# Patient Record
Sex: Male | Born: 1990 | Hispanic: No | Smoking: Never smoker
Health system: Southern US, Community
[De-identification: ages and names within clinical notes are randomized; demographics above are authoritative.]

---

## 2017-07-14 ENCOUNTER — Encounter: Payer: Self-pay | Admitting: Physician Assistant

## 2017-07-14 ENCOUNTER — Ambulatory Visit (INDEPENDENT_AMBULATORY_CARE_PROVIDER_SITE_OTHER): Payer: BLUE CROSS/BLUE SHIELD | Admitting: Physician Assistant

## 2017-07-14 VITALS — BP 118/73 | HR 74 | Temp 97.9°F | Resp 18 | Ht 70.55 in | Wt 153.2 lb

## 2017-07-14 DIAGNOSIS — Z131 Encounter for screening for diabetes mellitus: Secondary | ICD-10-CM

## 2017-07-14 DIAGNOSIS — Z13220 Encounter for screening for lipoid disorders: Secondary | ICD-10-CM | POA: Diagnosis not present

## 2017-07-14 DIAGNOSIS — Z130 Encounter for screening for diseases of the blood and blood-forming organs and certain disorders involving the immune mechanism: Secondary | ICD-10-CM

## 2017-07-14 DIAGNOSIS — M79671 Pain in right foot: Secondary | ICD-10-CM

## 2017-07-14 DIAGNOSIS — R0981 Nasal congestion: Secondary | ICD-10-CM | POA: Diagnosis not present

## 2017-07-14 DIAGNOSIS — Z114 Encounter for screening for human immunodeficiency virus [HIV]: Secondary | ICD-10-CM

## 2017-07-14 DIAGNOSIS — Z0000 Encounter for general adult medical examination without abnormal findings: Secondary | ICD-10-CM | POA: Diagnosis not present

## 2017-07-14 LAB — POCT URINALYSIS DIP (MANUAL ENTRY)
Bilirubin, UA: NEGATIVE
Glucose, UA: NEGATIVE mg/dL
Ketones, POC UA: NEGATIVE mg/dL
Leukocytes, UA: NEGATIVE
Nitrite, UA: NEGATIVE
Protein Ur, POC: NEGATIVE mg/dL
Spec Grav, UA: 1.02 (ref 1.010–1.025)
Urobilinogen, UA: 0.2 U/dL
pH, UA: 6.5 (ref 5.0–8.0)

## 2017-07-14 MED ORDER — FLUTICASONE PROPIONATE 50 MCG/ACT NA SUSP: 2.0000 | Freq: Every day | NASAL | 6 refills | Status: AC

## 2017-07-14 MED ORDER — MELOXICAM 15 MG PO TABS: 15.0000 mg | ORAL_TABLET | Freq: Every day | ORAL | 1 refills | Status: AC

## 2017-07-14 MED ORDER — CETIRIZINE HCL 10 MG PO TABS: 10.0000 mg | ORAL_TABLET | Freq: Every day | ORAL | 11 refills | Status: DC

## 2017-07-14 NOTE — Patient Instructions (Addendum)
You will be contacted with your lab results when they come back. Please make appointment with a dentist to have your teeth cleaned.  Go to the [employer] store and buy orthotics to wear in your shoes. Start taking Meloxicam for your foot pain. Use ice as needed for foot pain. Keep ice on your foot 20 minutes a time 2-3 times daily.  Come back if you are not better in 2-3 weeks.   Thank you for coming in today. I hope you feel we met your needs.  Feel free to call [facility] if you have any questions or further requests.  Please consider signing up for MyChart if you do not already have it, as this is a great way to communicate with me.  Best,  Whitney McVey, PA-C   Plantar Fasciitis Plantar fasciitis is a painful foot condition that affects the heel. It occurs when the band of tissue that connects the toes to the heel bone (plantar fascia) becomes irritated. This can happen after exercising too much or doing other repetitive activities (overuse injury). The pain from plantar fasciitis can range from mild irritation to severe pain that makes it difficult for you to walk or move. The pain is usually worse in the morning or after you have been sitting or lying down for a while. What are the causes? This condition may be caused by:  Standing for long periods of time.  Wearing shoes that do not fit.  Doing high-impact activities, including running, aerobics, and ballet.  Being overweight.  Having an abnormal way of walking (gait).  Having tight calf muscles.  Having high arches in your feet.  Starting a new athletic activity.  What are the signs or symptoms? The main symptom of this condition is heel pain. Other symptoms include:  Pain that gets worse after activity or exercise.  Pain that is worse in the morning or after resting.  Pain that goes away after you walk for a few minutes.  How is this diagnosed? This condition may be diagnosed based on your signs and symptoms. Your health  care provider will also do a physical exam to check for:  A tender area on the bottom of your foot.  A high arch in your foot.  Pain when you move your foot.  Difficulty moving your foot.  You may also need to have imaging studies to confirm the diagnosis. These can include:  X-rays.  Ultrasound.  MRI.  How is this treated? Treatment for plantar fasciitis depends on the severity of the condition. Your treatment may include:  Rest, ice, and over-the-counter pain medicines to manage your pain.  Exercises to stretch your calves and your plantar fascia.  A splint that holds your foot in a stretched, upward position while you sleep (night splint).  Physical therapy to relieve symptoms and prevent problems in the future.  Cortisone injections to relieve severe pain.  Extracorporeal shock wave therapy (ESWT) to stimulate damaged plantar fascia with electrical impulses. It is often used as a last resort before surgery.  Surgery, if other treatments have not worked after 12 months.  Follow these instructions at home:  Take medicines only as directed by your health care provider.  Avoid activities that cause pain.  Roll the bottom of your foot over a bag of ice or a bottle of cold water. Do this for 20 minutes, 3-4 times a day.  Perform simple stretches as directed by your health care provider.  Try wearing athletic shoes with air-sole  or gel-sole cushions or soft shoe inserts.  Wear a night splint while sleeping, if directed by your health care provider.  Keep all follow-up appointments with your health care provider. How is this prevented?  Do not perform exercises or activities that cause heel pain.  Consider finding low-impact activities if you continue to have problems.  Lose weight if you need to. The best way to prevent plantar fasciitis is to avoid the activities that aggravate your plantar fascia. Contact a health care provider if:  Your symptoms do not go  away after treatment with home care measures.  Your pain gets worse.  Your pain affects your ability to move or do your daily activities. This information is not intended to replace advice given to you by your health care provider. Make sure you discuss any questions you have with your health care provider. Document Released: 09/10/2001 Document Revised: 05/20/2016 Document Reviewed: 10/26/2014 Elsevier Interactive Patient Education  2018 [place] Maintenance, Male A healthy lifestyle and preventive care is important for your health and wellness. Ask your health care provider about what schedule of regular examinations is right for you. What should I know about weight and diet? Eat a Healthy Diet  Eat plenty of vegetables, fruits, whole grains, low-fat dairy products, and lean protein.  Do not eat a lot of foods high in solid fats, added sugars, or salt.  Maintain a Healthy Weight Regular exercise can help you achieve or maintain a healthy weight. You should:  Do at least 150 minutes of exercise each week. The exercise should increase your heart rate and make you sweat (moderate-intensity exercise).  Do strength-training exercises at least twice a week.  Watch Your Levels of Cholesterol and Blood Lipids  Have your blood tested for lipids and cholesterol every 5 years starting at 28 years of age. If you are at high risk for heart disease, you should start having your blood tested when you are 28 years old. You may need to have your cholesterol levels checked more often if: ? Your lipid or cholesterol levels are high. ? You are older than 28 years of age. ? You are at high risk for heart disease.  What should I know about cancer screening? Many types of cancers can be detected early and may often be prevented. Lung Cancer  You should be screened every year for lung cancer if: ? You are a current smoker who has smoked for at least 30 years. ? You are a former smoker  who has quit within the past 15 years.  Talk to your health care provider about your screening options, when you should start screening, and how often you should be screened.  Colorectal Cancer  Routine colorectal cancer screening usually begins at 28 years of age and should be repeated every 5-10 years until you are 28 years old. You may need to be screened more often if early forms of precancerous polyps or small growths are found. Your health care provider may recommend screening at an earlier age if you have risk factors for colon cancer.  Your health care provider may recommend using home test kits to check for hidden blood in the stool.  A small camera at the end of a tube can be used to examine your colon (sigmoidoscopy or colonoscopy). This checks for the earliest forms of colorectal cancer.  Prostate and Testicular Cancer  Depending on your age and overall health, your health care provider may do certain tests to screen  for prostate and testicular cancer.  Talk to your health care provider about any symptoms or concerns you have about testicular or prostate cancer.  Skin Cancer  Check your skin from head to toe regularly.  Tell your health care provider about any new moles or changes in moles, especially if: ? There is a change in a mole's size, shape, or color. ? You have a mole that is larger than a pencil eraser.  Always use sunscreen. Apply sunscreen liberally and repeat throughout the day.  Protect yourself by wearing long sleeves, pants, a wide-brimmed hat, and sunglasses when outside.  What should I know about heart disease, diabetes, and high blood pressure?  If you are 75-28 years of age, have your blood pressure checked every 3-5 years. If you are 88 years of age or older, have your blood pressure checked every year. You should have your blood pressure measured twice-once when you are at a hospital or clinic, and once when you are not at a hospital or clinic. Record  the average of the two measurements. To check your blood pressure when you are not at a hospital or clinic, you can use: ? An automated blood pressure machine at a pharmacy. ? A home blood pressure monitor.  Talk to your health care provider about your target blood pressure.  If you are between 41-36 years old, ask your health care provider if you should take aspirin to prevent heart disease.  Have regular diabetes screenings by checking your fasting blood sugar level. ? If you are at a normal weight and have a low risk for diabetes, have this test once every three years after the age of 78. ? If you are overweight and have a high risk for diabetes, consider being tested at a younger age or more often.  A one-time screening for abdominal aortic aneurysm (AAA) by ultrasound is recommended for men aged 45-75 years who are current or former smokers. What should I know about preventing infection? Hepatitis B If you have a higher risk for hepatitis B, you should be screened for this virus. Talk with your health care provider to find out if you are at risk for hepatitis B infection. Hepatitis C Blood testing is recommended for:  Everyone born from [birth year] through 1967.  Anyone with known risk factors for hepatitis C.  Sexually Transmitted Diseases (STDs)  You should be screened each year for STDs including gonorrhea and chlamydia if: ? You are sexually active and are younger than 28 years of age. ? You are older than 28 years of age and your health care provider tells you that you are at risk for this type of infection. ? Your sexual activity has changed since you were last screened and you are at an increased risk for chlamydia or gonorrhea. Ask your health care provider if you are at risk.  Talk with your health care provider about whether you are at high risk of being infected with HIV. Your health care provider may recommend a prescription medicine to help prevent HIV infection.  What else  can I do?  Schedule regular health, dental, and eye exams.  Stay current with your vaccines (immunizations).  Do not use any tobacco products, such as cigarettes, chewing tobacco, and e-cigarettes. If you need help quitting, ask your health care provider.  Limit alcohol intake to no more than 2 drinks per day. One drink equals 12 ounces of beer, 5 ounces of wine, or 1 ounces of hard liquor.  Do  not use street drugs.  Do not share needles.  Ask your health care provider for help if you need support or information about quitting drugs.  Tell your health care provider if you often feel depressed.  Tell your health care provider if you have ever been abused or do not feel safe at home. This information is not intended to replace advice given to you by your health care provider. Make sure you discuss any questions you have with your health care provider. Document Released: 06/13/2008 Document Revised: 08/14/2016 Document Reviewed: 09/19/2015 Elsevier Interactive Patient Education  [employer].

## 2017-07-14 NOTE — Progress Notes (Signed)
[facility] [place], [place] [address] [phone]  Date:  07/14/2017   Name:  Steve Alvarez   DOB:  1991/03/05   MRN:  [id number]  PCP:  No primary care provider on file.    Chief Complaint: New Patient (Initial Visit) (CPE)   History of Present Illness:  This is a 26 y.o. male with PMH none who is presenting for CPE. He came to the [place] from [place] about 1 year ago. He does not take any medication.   Complaints:  Post nasal drip.  Immunizations: UTD Dentist: none Eye: 20/20 b/l Diet: traditional [place] food. He is not fasting today.  Fam hx: healthy. Sexual hx: not active.  Tobacco/alcohol/substance use: No drugs, no alcohol, never smoker.   Review of Systems:  Review of Systems  Constitutional: Negative for activity change, appetite change and fatigue.  HENT: Positive for congestion, postnasal drip, rhinorrhea and sneezing. Negative for dental problem and tinnitus.   Eyes: Negative for visual disturbance.  Respiratory: Negative for cough, chest tightness, shortness of breath and wheezing.   Cardiovascular: Negative for chest pain, palpitations and leg swelling.  Gastrointestinal: Negative for abdominal pain, blood in stool, constipation, diarrhea, nausea and vomiting.  Endocrine: Negative for polydipsia, polyphagia and polyuria.  Genitourinary: Negative for decreased urine volume, difficulty urinating, discharge, hematuria, scrotal swelling and testicular pain.  Musculoskeletal: Positive for arthralgias (right foot pain) and gait problem. Negative for back pain and neck stiffness.  Allergic/Immunologic: Negative for environmental allergies and food allergies.  Neurological: Negative for dizziness, syncope, weakness, light-headedness and headaches.  Psychiatric/Behavioral: Negative for sleep disturbance. The patient is not nervous/anxious.     There are no active problems to display for this patient.   Prior to Admission medications   Not on File     No Known Allergies  History reviewed. No pertinent surgical history.  Social History  Substance Use Topics  . Smoking status: Never Smoker  . Smokeless tobacco: Never Used  . Alcohol use No    History reviewed. No pertinent family history.  Medication list has been reviewed and updated.  Physical Examination:  Physical Exam  Constitutional: He is oriented to person, place, and time. He appears well-developed and well-nourished. No distress.  HENT:  Head: Normocephalic and atraumatic.  Right Ear: External ear normal.  Left Ear: External ear normal.  Nose: Nose normal.  Mouth/Throat: No oropharyngeal exudate.  Eyes: Pupils are equal, round, and reactive to light. Conjunctivae and EOM are normal.  Neck: Normal range of motion. No thyromegaly present.  Cardiovascular: Normal rate, regular rhythm, normal heart sounds and intact distal pulses.   No murmur heard. Pulmonary/Chest: Effort normal and breath sounds normal. No respiratory distress. He has no wheezes.  Abdominal: Soft. Bowel sounds are normal. He exhibits no distension and no mass. There is no tenderness.  Musculoskeletal: Normal range of motion. He exhibits no edema.       Right foot: There is tenderness. There is normal range of motion, no bony tenderness and no deformity.       Feet:  Lymphadenopathy:    He has no cervical adenopathy.  Neurological: He is alert and oriented to person, place, and time. He has normal reflexes.  Skin: Skin is warm and dry.  Psychiatric: He has a normal mood and affect. His behavior is normal. Judgment and thought content normal.  Vitals reviewed.   BP 118/73 (BP Location: Right Arm, Patient Position: Sitting, Cuff Size: Normal)   Pulse 74  Temp 97.9 F (36.6 C) (Oral)   Resp 18   Ht 5' 10.55" (1.792 m)   Wt 153 lb 3.2 oz (69.5 kg)   SpO2 99%   BMI 21.64 kg/m   Assessment and Plan: 1. Annual physical exam - HM is UTD. Aniticapatory guidance provided. Labs are pending.  RTC in 1 year for annual exam.   2. Screening, lipid - Lipid panel  3. Screening for diabetes mellitus - CMP14+EGFR - POCT urinalysis dipstick  4. Screening, anemia, deficiency, iron - CBC with Differential/Platelet  5. Screening for HIV (human immunodeficiency virus) - HIV antibody  6. Nasal congestion - fluticasone (FLONASE) 50 MCG/ACT nasal spray; Place 2 sprays into both nostrils daily.  Dispense: 16 g; Refill: 6 - cetirizine (ZYRTEC) 10 MG tablet; Take 1 tablet (10 mg total) by mouth daily.  Dispense: 30 tablet; Refill: 11  7. Right foot pain - meloxicam (MOBIC) 15 MG tablet; Take 1 tablet (15 mg total) by mouth daily.  Dispense: 30 tablet; Refill: 1  Whitney Ilyssa Grennan, PA-C  [facility] 07/14/2017 3:28 PM

## 2017-07-15 ENCOUNTER — Encounter: Payer: Self-pay | Admitting: Physician Assistant

## 2017-07-15 LAB — CBC WITH DIFFERENTIAL/PLATELET
Basophils Absolute: 0 10*3/uL (ref 0.0–0.2)
Basos: 0 %
EOS (ABSOLUTE): 0.3 10*3/uL (ref 0.0–0.4)
Eos: 6 %
Hematocrit: 46.3 % (ref 37.5–51.0)
Hemoglobin: 15.6 g/dL (ref 13.0–17.7)
Immature Grans (Abs): 0 10*3/uL (ref 0.0–0.1)
Immature Granulocytes: 0 %
Lymphocytes Absolute: 2 10*3/uL (ref 0.7–3.1)
Lymphs: 41 %
MCH: 29.2 pg (ref 26.6–33.0)
MCHC: 33.7 g/dL (ref 31.5–35.7)
MCV: 87 fL (ref 79–97)
Monocytes Absolute: 0.4 10*3/uL (ref 0.1–0.9)
Monocytes: 8 %
Neutrophils Absolute: 2.2 10*3/uL (ref 1.4–7.0)
Neutrophils: 45 %
Platelets: 230 10*3/uL (ref 150–379)
RBC: 5.34 x10E6/uL (ref 4.14–5.80)
RDW: 13.7 % (ref 12.3–15.4)
WBC: 4.9 10*3/uL (ref 3.4–10.8)

## 2017-07-15 LAB — CMP14+EGFR
ALT: 14 IU/L (ref 0–44)
AST: 14 IU/L (ref 0–40)
Albumin/Globulin Ratio: 1.7 (ref 1.2–2.2)
Albumin: 4.8 g/dL (ref 3.5–5.5)
Alkaline Phosphatase: 44 IU/L (ref 39–117)
BUN/Creatinine Ratio: 19 (ref 9–20)
BUN: 15 mg/dL (ref 6–20)
Bilirubin Total: 0.4 mg/dL (ref 0.0–1.2)
CO2: 25 mmol/L (ref 20–29)
Calcium: 10 mg/dL (ref 8.7–10.2)
Chloride: 101 mmol/L (ref 96–106)
Creatinine, Ser: 0.8 mg/dL (ref 0.76–1.27)
GFR calc Af Amer: 141 mL/min/{1.73_m2} (ref >59)
GFR calc non Af Amer: 122 mL/min/{1.73_m2} (ref >59)
Globulin, Total: 2.8 g/dL (ref 1.5–4.5)
Glucose: 93 mg/dL (ref 65–99)
Potassium: 4 mmol/L (ref 3.5–5.2)
Sodium: 140 mmol/L (ref 134–144)
Total Protein: 7.6 g/dL (ref 6.0–8.5)

## 2017-07-15 LAB — LIPID PANEL
Chol/HDL Ratio: 2.7 ratio (ref 0.0–5.0)
Cholesterol, Total: 137 mg/dL (ref 100–199)
HDL: 50 mg/dL (ref >39)
LDL Calculated: 54 mg/dL (ref 0–99)
Triglycerides: 165 mg/dL — ABNORMAL HIGH (ref 0–149)
VLDL Cholesterol Cal: 33 mg/dL (ref 5–40)

## 2017-07-15 LAB — HIV ANTIBODY (ROUTINE TESTING W REFLEX): HIV Screen 4th Generation wRfx: NONREACTIVE

## 2017-07-15 NOTE — Progress Notes (Signed)
Please call pt and let him know his triglycerides are slightly elevated. He can correct this by cutting down on fatty foods, fried foods, limit red meat to once a week, eat balanced meals including a serving of vegetables and/or fruits with every meal. He should also start exercising at least 2-3 times per week to help with cholesterol. His liver, kidneys, blood count and salts in his blood look great.  He is negative for HIV.  Come back in 1 year for annual exam.  Thank you!

## 2017-07-17 ENCOUNTER — Ambulatory Visit (INDEPENDENT_AMBULATORY_CARE_PROVIDER_SITE_OTHER): Payer: BLUE CROSS/BLUE SHIELD | Admitting: Emergency Medicine

## 2017-07-17 ENCOUNTER — Ambulatory Visit (INDEPENDENT_AMBULATORY_CARE_PROVIDER_SITE_OTHER): Payer: BLUE CROSS/BLUE SHIELD

## 2017-07-17 ENCOUNTER — Encounter: Payer: Self-pay | Admitting: Emergency Medicine

## 2017-07-17 VITALS — BP 135/81 | HR 95 | Temp 98.3°F | Resp 16 | Ht 70.5 in | Wt 157.2 lb

## 2017-07-17 DIAGNOSIS — M79671 Pain in right foot: Secondary | ICD-10-CM

## 2017-07-17 DIAGNOSIS — L02611 Cutaneous abscess of right foot: Secondary | ICD-10-CM | POA: Diagnosis not present

## 2017-07-17 MED ORDER — CEPHALEXIN 500 MG PO CAPS: 500.0000 mg | ORAL_CAPSULE | Freq: Three times a day (TID) | ORAL | 0 refills | Status: AC

## 2017-07-17 NOTE — Progress Notes (Addendum)
Steve Alvarez 26 y.o.   Chief Complaint  Patient presents with  . Foot Pain    pain and swelling to R foot; per patient, feels like "something is in there"; per patient, intermittant unmeasured fever    HISTORY OF PRESENT ILLNESS: This is a 26 y.o. male complaining of right foot pain, feels there might be a FB in there.  HPI   Prior to Admission medications   Medication Sig Start Date End Date Taking? Authorizing Provider  cetirizine (ZYRTEC) 10 MG tablet Take 1 tablet (10 mg total) by mouth daily. 07/14/17  Yes McVey, Madelaine BhatElizabeth Whitney, PA-C  fluticasone (FLONASE) 50 MCG/ACT nasal spray Place 2 sprays into both nostrils daily. 07/14/17  Yes McVey, Madelaine BhatElizabeth Whitney, PA-C  meloxicam (MOBIC) 15 MG tablet Take 1 tablet (15 mg total) by mouth daily. 07/14/17  Yes McVey, Madelaine BhatElizabeth Whitney, PA-C    No Known Allergies  There are no active problems to display for this patient.   No past medical history on file.  No past surgical history on file.  Social History   Social History  . Marital status: Unknown    Spouse name: N/A  . Number of children: N/A  . Years of education: N/A   Occupational History  . Not on file.   Social History Main Topics  . Smoking status: Never Smoker  . Smokeless tobacco: Never Used  . Alcohol use No  . Drug use: No  . Sexual activity: Not on file   Other Topics Concern  . Not on file   Social History Narrative  . No narrative on file    No family history on file.   Review of Systems  Constitutional: Negative.  Negative for chills and fever.  Respiratory: Negative.  Negative for shortness of breath.   Gastrointestinal: Negative.  Negative for abdominal pain, nausea and vomiting.  Musculoskeletal:       Right foot pain  Neurological: Negative for dizziness and headaches.  Endo/Heme/Allergies: Negative.   All other systems reviewed and are negative.  Vitals:   07/17/17 1335  BP: 135/81  Pulse: 95  Resp: 16  Temp: 98.3 F (36.8 C)       Physical Exam  Constitutional: He is oriented to person, place, and time. He appears well-developed and well-nourished.  HENT:  Head: Normocephalic and atraumatic.  Eyes: Pupils are equal, round, and reactive to light. EOM are normal.  Neck: Normal range of motion.  Cardiovascular: Normal rate.   Pulmonary/Chest: Effort normal.  Musculoskeletal:  Right foot: +erythematous round spot very suggestive of embedded FB; tender and fluctuant area.  Neurological: He is alert and oriented to person, place, and time. No sensory deficit. He exhibits normal muscle tone.  Skin: Skin is warm and dry. Capillary refill takes less than 2 seconds.  Psychiatric: He has a normal mood and affect. His behavior is normal.  Vitals reviewed.  PROCEDURE NOTE: I&D of Abscess Verbal consent obtained. Local anesthesia with 1cc of 2% lidocaine with Epi. Site cleansed with Betadine.  Incision of 1cm was made using a 11 blade, discharge of copious amounts of pus. ?FB mixed in with pus.Wound cavity was explored with curved hemostats and lightly packed with 1/4" plain packing. Cleansed and dressed. After care instructions provided. Patient to return to clinic in 2 days for reevaluation/repacking.       ASSESSMENT & PLAN: Steve Alvarez was seen today for foot pain.  Diagnoses and all orders for this visit:  Abscess of right foot Comments: suspected FB Orders: -  WOUND CULTURE  Right foot pain -     DG Foot 2 Views Right; Future  Other orders -     cephALEXin (KEFLEX) 500 MG capsule; Take 1 capsule (500 mg total) by mouth 3 (three) times daily.     Patient Instructions       IF you received an x-ray today, you will receive an invoice from [facility]. Please contact [facility] at [phone] with questions or concerns regarding your invoice.   IF you received labwork today, you will receive an invoice from [facility]. Please contact [facility] at [phone] with questions or  concerns regarding your invoice.   Our billing staff will not be able to assist you with questions regarding bills from these companies.  You will be contacted with the lab results as soon as they are available. The fastest way to get your results is to activate your My Chart account. Instructions are located on the last page of this paperwork. If you have not heard from [place] regarding the results in 2 weeks, please contact this office.     Incision and Drainage, Care After Refer to this sheet in the next few weeks. These instructions provide you with information about caring for yourself after your procedure. Your health care provider may also give you more specific instructions. Your treatment has been planned according to current medical practices, but problems sometimes occur. Call your health care provider if you have any problems or questions after your procedure. What can I expect after the procedure? After the procedure, it is common to have:  Pain or discomfort around your incision site.  Drainage from your incision.  Follow these instructions at home:  Take over-the-counter and prescription medicines only as told by your health care provider.  If you were prescribed an antibiotic medicine, take it as told by your health care provider.Do not stop taking the antibiotic even if you start to feel better.  Followinstructions from your health care provider about: ? How to take care of your incision. ? When and how you should change your packing and bandage (dressing). Wash your hands with soap and water before you change your dressing. If soap and water are not available, use hand sanitizer. ? When you should remove your dressing.  Do not take baths, swim, or use a hot tub until your health care provider approves.  Keep all follow-up visits as told by your health care provider. This is important.  Check your incision area every day for signs of infection. Check for: ? More  redness, swelling, or pain. ? More fluid or blood. ? Warmth. ? Pus or a bad smell. Contact a health care provider if:  Your cyst or abscess returns.  You have a fever.  You have more redness, swelling, or pain around your incision.  You have more fluid or blood coming from your incision.  Your incision feels warm to the touch.  You have pus or a bad smell coming from your incision. Get help right away if:  You have severe pain or bleeding.  You cannot eat or drink without vomiting.  You have decreased urine output.  You become short of breath.  You have chest pain.  You cough up blood.  The area where the incision and drainage occurred becomes numb or it tingles. This information is not intended to replace advice given to you by your health care provider. Make sure you discuss any questions you have with your health care provider. Document Released: 03/09/2012  Document Revised: 05/17/2016 Document Reviewed: 10/06/2015 Elsevier Interactive Patient Education  2017 [employer]      Edwina Barth, MD [facility]

## 2017-07-17 NOTE — Patient Instructions (Addendum)
     IF you received an x-ray today, you will receive an invoice from [facility]. Please contact [facility] at [phone] with questions or concerns regarding your invoice.   IF you received labwork today, you will receive an invoice from [facility]. Please contact [facility] at [phone] with questions or concerns regarding your invoice.   Our billing staff will not be able to assist you with questions regarding bills from these companies.  You will be contacted with the lab results as soon as they are available. The fastest way to get your results is to activate your My Chart account. Instructions are located on the last page of this paperwork. If you have not heard from us regarding the results in 2 weeks, please contact this office.     Incision and Drainage, Care After Refer to this sheet in the next few weeks. These instructions provide you with information about caring for yourself after your procedure. Your health care provider may also give you more specific instructions. Your treatment has been planned according to current medical practices, but problems sometimes occur. Call your health care provider if you have any problems or questions after your procedure. What can I expect after the procedure? After the procedure, it is common to have:  Pain or discomfort around your incision site.  Drainage from your incision.  Follow these instructions at home:  Take over-the-counter and prescription medicines only as told by your health care provider.  If you were prescribed an antibiotic medicine, take it as told by your health care provider.Do not stop taking the antibiotic even if you start to feel better.  Followinstructions from your health care provider about: ? How to take care of your incision. ? When and how you should change your packing and bandage (dressing). Wash your hands with soap and water before you change your dressing. If soap and water are  not available, use hand sanitizer. ? When you should remove your dressing.  Do not take baths, swim, or use a hot tub until your health care provider approves.  Keep all follow-up visits as told by your health care provider. This is important.  Check your incision area every day for signs of infection. Check for: ? More redness, swelling, or pain. ? More fluid or blood. ? Warmth. ? Pus or a bad smell. Contact a health care provider if:  Your cyst or abscess returns.  You have a fever.  You have more redness, swelling, or pain around your incision.  You have more fluid or blood coming from your incision.  Your incision feels warm to the touch.  You have pus or a bad smell coming from your incision. Get help right away if:  You have severe pain or bleeding.  You cannot eat or drink without vomiting.  You have decreased urine output.  You become short of breath.  You have chest pain.  You cough up blood.  The area where the incision and drainage occurred becomes numb or it tingles. This information is not intended to replace advice given to you by your health care provider. Make sure you discuss any questions you have with your health care provider. Document Released: 03/09/2012 Document Revised: 05/17/2016 Document Reviewed: 10/06/2015 Elsevier Interactive Patient Education  2017 Elsevier Inc.  

## 2017-07-19 ENCOUNTER — Ambulatory Visit (INDEPENDENT_AMBULATORY_CARE_PROVIDER_SITE_OTHER): Payer: BLUE CROSS/BLUE SHIELD | Admitting: Urgent Care

## 2017-07-19 ENCOUNTER — Encounter: Payer: Self-pay | Admitting: Urgent Care

## 2017-07-19 VITALS — BP 116/71 | HR 91 | Temp 98.8°F | Resp 18 | Ht 70.0 in | Wt 156.0 lb

## 2017-07-19 DIAGNOSIS — Z5189 Encounter for other specified aftercare: Secondary | ICD-10-CM

## 2017-07-19 DIAGNOSIS — L02611 Cutaneous abscess of right foot: Secondary | ICD-10-CM

## 2017-07-19 DIAGNOSIS — M79671 Pain in right foot: Secondary | ICD-10-CM

## 2017-07-19 NOTE — Progress Notes (Signed)
    MRN: [id number] DOB: [birth year]  Subjective:   Steve Alvarez is a 26 y.o. male presenting for follow up on right foot abscess. Last OV was 07/17/2017, had incision and drainage and was started on Keflex. Today, patient reports improvement in his pain, abscess. Denies fever, redness, swelling, spontaneous drainage of pus or bleeding. He has not changed dressings.  Steve Alvarez has a current medication list which includes the following prescription(s): cephalexin, cetirizine, fluticasone, and meloxicam. Also has No Known Allergies.  Steve Alvarez  has no past medical history on file. Also  has no past surgical history on file.  Objective:   Vitals: BP 116/71   Pulse 91   Temp 98.8 F (37.1 C) (Oral)   Resp 18   Ht 5\' 10"  (1.778 m)   Wt 156 lb (70.8 kg)   SpO2 98%   BMI 22.38 kg/m   Physical Exam  Constitutional: He is oriented to person, place, and time. He appears well-developed and well-nourished.  Cardiovascular: Normal rate.   Pulmonary/Chest: Effort normal.  Musculoskeletal:       Feet:  Neurological: He is alert and oriented to person, place, and time.   WOUND CARE: Dressing removed, packing in place. <1cc of purulent material expressed. Packing loosely placed, cleansed and dressed.  Assessment and Plan :   1. Abscess of right foot 2. Right foot pain 3. Encounter for wound care - Improved, continue Keflex. Recommended dressing changes, antibiotic course, rtc in 2 days for wound care.   Wallis BambergMario Calvyn Kurtzman, PA-C [facility] [phone] 07/19/2017 9:44 AM

## 2017-07-19 NOTE — Patient Instructions (Addendum)
Change your dressing once a day and leave your packing in place.   Incision and Drainage, Care After Refer to this sheet in the next few weeks. These instructions provide you with information about caring for yourself after your procedure. Your health care provider may also give you more specific instructions. Your treatment has been planned according to current medical practices, but problems sometimes occur. Call your health care provider if you have any problems or questions after your procedure. What can I expect after the procedure? After the procedure, it is common to have:  Pain or discomfort around your incision site.  Drainage from your incision.  Follow these instructions at home:  Take over-the-counter and prescription medicines only as told by your health care provider.  If you were prescribed an antibiotic medicine, take it as told by your health care provider.Do not stop taking the antibiotic even if you start to feel better.  Followinstructions from your health care provider about: ? How to take care of your incision. ? When and how you should change your packing and bandage (dressing). Wash your hands with soap and water before you change your dressing. If soap and water are not available, use hand sanitizer. ? When you should remove your dressing.  Do not take baths, swim, or use a hot tub until your health care provider approves.  Keep all follow-up visits as told by your health care provider. This is important.  Check your incision area every day for signs of infection. Check for: ? More redness, swelling, or pain. ? More fluid or blood. ? Warmth. ? Pus or a bad smell. Contact a health care provider if:  Your cyst or abscess returns.  You have a fever.  You have more redness, swelling, or pain around your incision.  You have more fluid or blood coming from your incision.  Your incision feels warm to the touch.  You have pus or a bad smell coming from  your incision. Get help right away if:  You have severe pain or bleeding.  You cannot eat or drink without vomiting.  You have decreased urine output.  You become short of breath.  You have chest pain.  You cough up blood.  The area where the incision and drainage occurred becomes numb or it tingles. This information is not intended to replace advice given to you by your health care provider. Make sure you discuss any questions you have with your health care provider. Document Released: 03/09/2012 Document Revised: 05/17/2016 Document Reviewed: 10/06/2015 Elsevier Interactive Patient Education  2017 [employer]     IF you received an x-ray today, you will receive an invoice from [facility]. Please contact [facility] at [phone] with questions or concerns regarding your invoice.   IF you received labwork today, you will receive an invoice from [facility]. Please contact [facility] at [phone] with questions or concerns regarding your invoice.   Our billing staff will not be able to assist you with questions regarding bills from these companies.  You will be contacted with the lab results as soon as they are available. The fastest way to get your results is to activate your My Chart account. Instructions are located on the last page of this paperwork. If you have not heard from us regarding the results in 2 weeks, please contact this office.

## 2017-07-20 LAB — WOUND CULTURE

## 2017-07-21 ENCOUNTER — Encounter: Payer: Self-pay | Admitting: Urgent Care

## 2017-07-21 ENCOUNTER — Ambulatory Visit (INDEPENDENT_AMBULATORY_CARE_PROVIDER_SITE_OTHER): Payer: BLUE CROSS/BLUE SHIELD | Admitting: Urgent Care

## 2017-07-21 VITALS — BP 121/79 | HR 67 | Temp 97.5°F | Resp 14 | Ht 70.0 in | Wt 157.0 lb

## 2017-07-21 DIAGNOSIS — M79671 Pain in right foot: Secondary | ICD-10-CM

## 2017-07-21 DIAGNOSIS — L02611 Cutaneous abscess of right foot: Secondary | ICD-10-CM

## 2017-07-21 DIAGNOSIS — Z5189 Encounter for other specified aftercare: Secondary | ICD-10-CM

## 2017-07-21 NOTE — Progress Notes (Signed)
    MRN: [id number] DOB: May 22, 1991  Subjective:   Steve Alvarez is a 26 y.o. male presenting for follow up on right foot abscess. Last OV was 07/19/2017. Today, he reports significant improvement. He has changed dressings at home. Denies fever, spontaneous drainage, bleeding. He is walking much better without pain. He is still finishing course of antibiotic.  Steve Alvarez has a current medication list which includes the following prescription(s): cephalexin, cetirizine, fluticasone, and meloxicam. Also has No Known Allergies.  Steve Alvarez denies past medical and surgical history.   Objective:   Vitals: BP 121/79   Pulse 67   Temp (!) 97.5 F (36.4 C) (Oral)   Resp 14   Ht 5\' 10"  (1.778 m)   Wt 157 lb (71.2 kg)   SpO2 99%   BMI 22.53 kg/m   Physical Exam  Constitutional: He is oriented to person, place, and time. He appears well-developed and well-nourished.  Cardiovascular: Normal rate.   Pulmonary/Chest: Effort normal.  Musculoskeletal:       Feet:  Neurological: He is alert and oriented to person, place, and time.   WOUND CARE: Packing removed. Wound cleansed. Less than 1cc of serasanguinous fluid was expressed. Wound repacked loosely with 1/4" packing and dressing applied. Patient tolerated this very well.  Assessment and Plan :   1. Abscess of right foot 2. Right foot pain 3. Encounter for wound care - Healing well, finish antibiotic course. Instruction for home wound care provided. Patient to rtc as needed.  Wallis BambergMario Tuleen Mandelbaum, PA-C [facility] [phone] 07/21/2017 8:58 AM

## 2017-07-21 NOTE — Patient Instructions (Addendum)
Keep changing your dressings daily. In 2 days, you can remove the packing at home. Continue applying a dressing until your wound is healed and closed.   Incision and Drainage, Care After Refer to this sheet in the next few weeks. These instructions provide you with information about caring for yourself after your procedure. Your health care provider may also give you more specific instructions. Your treatment has been planned according to current medical practices, but problems sometimes occur. Call your health care provider if you have any problems or questions after your procedure. What can I expect after the procedure? After the procedure, it is common to have:  Pain or discomfort around your incision site.  Drainage from your incision.  Follow these instructions at home:  Take over-the-counter and prescription medicines only as told by your health care provider.  If you were prescribed an antibiotic medicine, take it as told by your health care provider.Do not stop taking the antibiotic even if you start to feel better.  Followinstructions from your health care provider about: ? How to take care of your incision. ? When and how you should change your packing and bandage (dressing). Wash your hands with soap and water before you change your dressing. If soap and water are not available, use hand sanitizer. ? When you should remove your dressing.  Do not take baths, swim, or use a hot tub until your health care provider approves.  Keep all follow-up visits as told by your health care provider. This is important.  Check your incision area every day for signs of infection. Check for: ? More redness, swelling, or pain. ? More fluid or blood. ? Warmth. ? Pus or a bad smell. Contact a health care provider if:  Your cyst or abscess returns.  You have a fever.  You have more redness, swelling, or pain around your incision.  You have more fluid or blood coming from your  incision.  Your incision feels warm to the touch.  You have pus or a bad smell coming from your incision. Get help right away if:  You have severe pain or bleeding.  You cannot eat or drink without vomiting.  You have decreased urine output.  You become short of breath.  You have chest pain.  You cough up blood.  The area where the incision and drainage occurred becomes numb or it tingles. This information is not intended to replace advice given to you by your health care provider. Make sure you discuss any questions you have with your health care provider. Document Released: 03/09/2012 Document Revised: 05/17/2016 Document Reviewed: 10/06/2015 Elsevier Interactive Patient Education  2017 [employer]   IF you received an x-ray today, you will receive an invoice from [facility]. Please contact [facility] at [phone] with questions or concerns regarding your invoice.   IF you received labwork today, you will receive an invoice from [facility]. Please contact [facility] at [phone] with questions or concerns regarding your invoice.   Our billing staff will not be able to assist you with questions regarding bills from these companies.  You will be contacted with the lab results as soon as they are available. The fastest way to get your results is to activate your My Chart account. Instructions are located on the last page of this paperwork. If you have not heard from us regarding the results in 2 weeks, please contact this office.

## 2017-10-07 ENCOUNTER — Encounter: Payer: Self-pay | Admitting: Physician Assistant

## 2017-10-07 ENCOUNTER — Ambulatory Visit (INDEPENDENT_AMBULATORY_CARE_PROVIDER_SITE_OTHER): Payer: BLUE CROSS/BLUE SHIELD | Admitting: Physician Assistant

## 2017-10-07 VITALS — BP 120/76 | HR 74 | Temp 98.6°F | Resp 16 | Ht 70.0 in | Wt 163.2 lb

## 2017-10-07 DIAGNOSIS — H00019 Hordeolum externum unspecified eye, unspecified eyelid: Secondary | ICD-10-CM

## 2017-10-07 DIAGNOSIS — J302 Other seasonal allergic rhinitis: Secondary | ICD-10-CM

## 2017-10-07 MED ORDER — BACITRACIN 500 UNIT/GM EX OINT: 1.0000 "application " | TOPICAL_OINTMENT | Freq: Two times a day (BID) | CUTANEOUS | 0 refills | Status: AC

## 2017-10-07 MED ORDER — LORATADINE-PSEUDOEPHEDRINE ER 10-240 MG PO TB24: 1.0000 | ORAL_TABLET | Freq: Every day | ORAL | 3 refills | Status: AC

## 2017-10-07 NOTE — Progress Notes (Signed)
   Steve Alvarez  MRN: [id number] DOB: Sep 22, 1991  PCP: Patient, No Pcp Per  Subjective:  Pt is a 26 year old male who presents to clinic for eye pain x 20 days. C/o swelling and pain of both upper eyelids. More painful to touch. Endorses runny nose and sneezing.  Denies fever, chills, change in vision, drainage from eyes.    Review of Systems  HENT: Positive for congestion, rhinorrhea and sneezing.   Eyes: Positive for pain. Negative for photophobia, discharge, redness, itching and visual disturbance.  Respiratory: Negative for cough.   Allergic/Immunologic: Positive for environmental allergies.    Patient Active Problem List   Diagnosis Date Noted  . Right foot pain 07/17/2017  . Abscess of right foot 07/17/2017    Current Outpatient Prescriptions on File Prior to Visit  Medication Sig Dispense Refill  . fluticasone (FLONASE) 50 MCG/ACT nasal spray Place 2 sprays into both nostrils daily. (Patient not taking: Reported on 10/07/2017) 16 g 6  . meloxicam (MOBIC) 15 MG tablet Take 1 tablet (15 mg total) by mouth daily. (Patient not taking: Reported on 10/07/2017) 30 tablet 1   No current facility-administered medications on file prior to visit.     No Known Allergies   Objective:  BP 120/76   Pulse 74   Temp 98.6 F (37 C) (Oral)   Resp 16   Ht  (1.778 m)   Wt 163 lb 3.2 oz (74 kg)   SpO2 99%   BMI 23.42 kg/m   Physical Exam  Constitutional: He is oriented to person, place, and time and well-developed, well-nourished, and in no distress. No distress.  HENT:  Right Ear: Tympanic membrane normal.  Left Ear: Tympanic membrane normal.  Nose: Mucosal edema present. No rhinorrhea. Right sinus exhibits no maxillary sinus tenderness and no frontal sinus tenderness. Left sinus exhibits no maxillary sinus tenderness and no frontal sinus tenderness.  Mouth/Throat: Oropharynx is clear and moist and mucous membranes are normal.  Eyes: Pupils are equal, round, and reactive to  light. Conjunctivae are normal. Right eye exhibits hordeolum (Upper medial lid). Left eye exhibits hordeolum (Upper medial lid).  Cardiovascular: Normal rate, regular rhythm and normal heart sounds.   Neurological: He is alert and oriented to person, place, and time. GCS score is 15.  Skin: Skin is warm and dry.  Psychiatric: Mood, memory, affect and judgment normal.  Vitals reviewed.   Assessment and Plan :  1. Hordeolum externum, unspecified laterality - bacitracin 500 UNIT/GM ointment; Apply 1 application topically 2 (two) times daily.  Dispense: 14 g; Refill: 0 - Advised warm compresses daily. If no improvement RTC - consider opthamology referral.  2. Seasonal allergies - loratadine-pseudoephedrine (CLARITIN-D 24 HOUR) 10-240 MG 24 hr tablet; Take 1 tablet by mouth daily. For allergies  Dispense: 60 tablet; Refill: 3   Whitney Tashiya Souders, PA-C  [facility] 10/07/2017 2:29 PM

## 2017-10-07 NOTE — Patient Instructions (Addendum)
See below for treatment of your eyelids - you will use a hot compresses for 15 min 2 or 3 times daily.  Apply the ointment twice daily to your eye lids.   Take Claritin-D daily for your allergies.   What is a stye?-A stye is a red and painful lump on the eyelid. It happens when a small gland on the edge of the eyelid gets infected or inflamed. Styes can occur on the upper or lower eyelids. Most styes get better on their own after a few days to a week. Another word for stye is "hordeolum." People sometimes get a stye confused with a different eye problem called a "chalazion." A chalazion also causes a lump on the eyelid. But a stye is caused by an infection and is painful. A chalazion is not tender or painful, but it often lasts longer than a stye does. What are the symptoms of a stye?-People who have a stye have a red and painful lump on the edge of their eyelid. A stye usually develops over a few days. It can look like a pimple. Styes can cause other symptoms, too, such as tearing and eyelid pain and swelling. Is there a test for a stye?-No. But your doctor or nurse should be able to tell if you have a stye by talking with you and doing an exam. Is there anything I can do on my own to feel better?-Yes. To ease your symptoms and help your stye get better, you can put a warm, wet compress on the stye. Wet a clean washcloth with warm water and put it over your stye. When the washcloth cools, reheat it with warm water and put it back over the stye. Repeat these steps for 5 to 15 minutes, and try to do this 3 to 4 times a day. You should not squeeze or pop your stye. Also, you should not wear eye makeup or contact lenses until your stye is all better. Should I see a doctor or nurse?-See your doctor or nurse if: ?Your stye doesn't go away after you treat it on your own for 1 week ?Your stye gets very big, bleeds, or affects your vision ?Your whole eye is red, or your whole eyelid is red and  swollen ?The redness or swelling spreads to your cheek or other parts of your face What treatments might my doctor use?-If your stye doesn't get better or if it leads to other problems, your doctor might: ?Prescribe a cream or ointment that goes in the eye and on the eyelid ?Prescribe antibiotic medicines ?Drain the stye Can styes be prevented?-Yes. To lower your chances of getting a stye, you can: ?Wash your hands before you touch your eyes ?Wash your hands before you put in contact lenses and keep your contact lenses clean (if you wear contact lenses) ?Take off your eye makeup each night ?Not share eye makeup with other people    IF you received an x-ray today, you will receive an invoice from [facility]. Please contact [facility] at [phone] with questions or concerns regarding your invoice.   IF you received labwork today, you will receive an invoice from [facility]. Please contact [facility] at [phone] with questions or concerns regarding your invoice.   Our billing staff will not be able to assist you with questions regarding bills from these companies.  You will be contacted with the lab results as soon as they are available. The fastest way to get your results is to activate your My  Chart account. Instructions are located on the last page of this paperwork. If you have not heard from [place] regarding the results in 2 weeks, please contact this office.

## 2017-10-20 ENCOUNTER — Ambulatory Visit (INDEPENDENT_AMBULATORY_CARE_PROVIDER_SITE_OTHER): Payer: BLUE CROSS/BLUE SHIELD | Admitting: Family Medicine

## 2017-10-20 ENCOUNTER — Encounter: Payer: Self-pay | Admitting: Family Medicine

## 2017-10-20 VITALS — BP 112/66 | HR 61 | Temp 98.5°F | Resp 16 | Ht 70.0 in | Wt 159.8 lb

## 2017-10-20 DIAGNOSIS — J069 Acute upper respiratory infection, unspecified: Secondary | ICD-10-CM

## 2017-10-20 DIAGNOSIS — H00011 Hordeolum externum right upper eyelid: Secondary | ICD-10-CM | POA: Diagnosis not present

## 2017-10-20 MED ORDER — DOXYCYCLINE HYCLATE 100 MG PO CAPS: 100.0000 mg | ORAL_CAPSULE | Freq: Two times a day (BID) | ORAL | 0 refills | Status: AC

## 2017-10-20 MED ORDER — TOBRAMYCIN 0.3 % OP SOLN: 2.0000 [drp] | OPHTHALMIC | 0 refills | Status: DC

## 2017-10-20 NOTE — Progress Notes (Signed)
Patient ID: Steve Alvarez, male    DOB: 1991/02/21  Age: 26 y.o. MRN: [id number]  Chief Complaint  Patient presents with  . Follow-up    RIGHT EYE - swelling OV on 10/07/17 with [facility]    Subjective:   Patient continues to have a swollen medial right upper eyelid where he was treated for a hordeolum 2 weeks ago. It is not resolved at all. It continues to hurt him. He also continues with a runny nose and intermittent right or left nasal congestion. He is not known to be allergic to any medication.  Current allergies, medications, problem list, past/family and social histories reviewed.  Objective:  BP 112/66 (BP Location: Left Arm, Patient Position: Sitting, Cuff Size: Normal)   Pulse 61   Temp 98.5 F (36.9 C) (Oral)   Resp 16   Ht 5\' 10"  (1.778 m)   Wt 159 lb 12.8 oz (72.5 kg)   SpO2 98%   BMI 22.93 kg/m   Eyes PERRLA. Fundi benign. Right upper eyelid medially just above the upper canthus is swollen and cystic and has a white central area to it. It is not draining. Using a 20-gauge needle a tiny hole was made in the cyst and some white pus expressed. This was cultured. He had pain from the needle obviously and did not tolerate touching it so was unable to open the cyst any larger with the needle. The pus was expressed from a tiny hole which will probably close back over.  Assessment & Plan:   Assessment: 1. Hordeolum externum of right upper eyelid   2. Acute upper respiratory infection       Plan: Begin doxycycline twice daily for infection and some tobramycin eyedrops.  Orders Placed This Encounter  Procedures  . WOUND CULTURE    Order Specific Question:   Source    Answer:   right upper eyelid    Meds ordered this encounter  Medications  . doxycycline (VIBRAMYCIN) 100 MG capsule    Sig: Take 1 capsule (100 mg total) by mouth 2 (two) times daily.    Dispense:  20 capsule    Refill:  0  . tobramycin (TOBREX) 0.3 % ophthalmic solution    Sig: Place 2 drops into the  right eye every 4 (four) hours.    Dispense:  5 mL    Refill:  0         Patient Instructions   Take doxycycline 1 pill twice daily with breakfast and with supper for infection of eyelid  Use the antibiotic eyedrops (tobramycin) 2 drops in the right eye about every 4 hours when you are awake for 3 or 4 days  Get some over-the-counter (not prescription) Claritin-D (loratadine D) and take one every night for nose congestion  Return if worse or not improving    IF you received an x-ray today, you will receive an invoice from [facility]. Please contact [facility] at [phone] with questions or concerns regarding your invoice.   IF you received labwork today, you will receive an invoice from [facility]. Please contact [facility] at [phone] with questions or concerns regarding your invoice.   Our billing staff will not be able to assist you with questions regarding bills from these companies.  You will be contacted with the lab results as soon as they are available. The fastest way to get your results is to activate your My Chart account. Instructions are located on the last page of this paperwork. If you have not heard  from us regarding the results in 2 weeks, please contact this office.        Return if symptoms worsen or fail to improve.   HOPPER,DAVID, MD 10/20/2017

## 2017-10-20 NOTE — Patient Instructions (Addendum)
Take doxycycline 1 pill twice daily with breakfast and with supper for infection of eyelid  Use the antibiotic eyedrops (tobramycin) 2 drops in the right eye about every 4 hours when you are awake for 3 or 4 days  Get some over-the-counter (not prescription) Claritin-D (loratadine D) and take one every night for nose congestion  Return if worse or not improving    IF you received an x-ray today, you will receive an invoice from [facility]. Please contact [facility] at [phone] with questions or concerns regarding your invoice.   IF you received labwork today, you will receive an invoice from [facility]. Please contact [facility] at [phone] with questions or concerns regarding your invoice.   Our billing staff will not be able to assist you with questions regarding bills from these companies.  You will be contacted with the lab results as soon as they are available. The fastest way to get your results is to activate your My Chart account. Instructions are located on the last page of this paperwork. If you have not heard from us regarding the results in 2 weeks, please contact this office.

## 2017-10-22 LAB — WOUND CULTURE: Organism ID, Bacteria: NONE SEEN

## 2017-11-13 ENCOUNTER — Other Ambulatory Visit: Payer: Self-pay

## 2017-11-13 ENCOUNTER — Ambulatory Visit (INDEPENDENT_AMBULATORY_CARE_PROVIDER_SITE_OTHER): Payer: BLUE CROSS/BLUE SHIELD

## 2017-11-13 ENCOUNTER — Ambulatory Visit: Payer: BLUE CROSS/BLUE SHIELD | Admitting: Family Medicine

## 2017-11-13 ENCOUNTER — Encounter: Payer: Self-pay | Admitting: Family Medicine

## 2017-11-13 VITALS — BP 102/66 | HR 85 | Temp 98.8°F | Resp 16 | Ht 70.0 in | Wt 163.2 lb

## 2017-11-13 DIAGNOSIS — L03213 Periorbital cellulitis: Secondary | ICD-10-CM | POA: Diagnosis not present

## 2017-11-13 DIAGNOSIS — H00011 Hordeolum externum right upper eyelid: Secondary | ICD-10-CM

## 2017-11-13 LAB — POCT CBC
Granulocyte percent: 56.9 %G (ref 37–80)
HEMATOCRIT: 42.6 % — AB (ref 43.5–53.7)
Hemoglobin: 14.5 g/dL (ref 14.1–18.1)
LYMPH, POC: 1.7 (ref 0.6–3.4)
MCH, POC: 28.9 pg (ref 27–31.2)
MCHC: 34 g/dL (ref 31.8–35.4)
MCV: 85.1 fL (ref 80–97)
MID (CBC): 0.3 (ref 0–0.9)
MPV: 6.5 fL (ref 0–99.8)
POC GRANULOCYTE: 2.7 (ref 2–6.9)
POC LYMPH %: 37.1 % (ref 10–50)
POC MID %: 6 % (ref 0–12)
Platelet Count, POC: 185 10*3/uL (ref 142–424)
RBC: 5 M/uL (ref 4.69–6.13)
RDW, POC: 12.8 %
WBC: 4.7 10*3/uL (ref 4.6–10.2)

## 2017-11-13 MED ORDER — SULFAMETHOXAZOLE-TRIMETHOPRIM 800-160 MG PO TABS: 1.0000 | ORAL_TABLET | Freq: Two times a day (BID) | ORAL | 0 refills | Status: AC

## 2017-11-13 MED ORDER — TOBRAMYCIN 0.3 % OP SOLN: 2.0000 [drp] | OPHTHALMIC | 0 refills | Status: AC

## 2017-11-13 MED ORDER — AMOXICILLIN-POT CLAVULANATE 875-125 MG PO TABS: 1.0000 | ORAL_TABLET | Freq: Two times a day (BID) | ORAL | 0 refills | Status: AC

## 2017-11-13 NOTE — Patient Instructions (Addendum)
I believe you have periorbital cellulitis Stay home from work today and tomorrow 11/14/2017  Follow up for re-examination on Saturday 11/15/2017 If the eye is getting worse we will have to send you to get IV antibiotics  Antibiotic - You have received the antibiotics Bactrim and Augmentin to take twice a day for right eye cellulitis. It may take several days before you feel any benefit from this medicine. Be sure to take all the medication prescribed, even if you are feeling better before it is finished. Do not drink alcohol while taking antibiotics.  It is unknown whether you will develop an adverse reaction (diarrhea, rash, nausea or vomiting) or an allergic reaction (trouble breathing, anaphylaxis).       IF you received an x-ray today, you will receive an invoice from [facility]. Please contact [facility] at [phone] with questions or concerns regarding your invoice.   IF you received labwork today, you will receive an invoice from [facility]. Please contact [facility] at [phone] with questions or concerns regarding your invoice.   Our billing staff will not be able to assist you with questions regarding bills from these companies.  You will be contacted with the lab results as soon as they are available. The fastest way to get your results is to activate your My Chart account. Instructions are located on the last page of this paperwork. If you have not heard from us regarding the results in 2 weeks, please contact this office.     Orbital Cellulitis Orbital cellulitis is an infection in the eye socket (orbit) and the tissues that surround the eye. The infection can spread to the eyelids, eyebrow area, and cheek. It can also cause a pocket of pus to develop around the eye (orbital abscess). In severe cases, the infection can spread to the brain. Orbital cellulitis is a medical emergency. What are the causes? The most common cause of this condition is a bacterial  infection. The infection usually spreads to the eye socket from another part of the body. The infection may start in:  The nose or sinuses.  The eyelids.  Facial skin.  The bloodstream.  What increases the risk? This condition is more likely to develop in people who have recently had one of the following:  Upper respiratory infection.  Sinus infection.  Eyelid or facial infection.  Eye injury.  Infection that affects the entire body or the bloodstream (systemic infection).  What are the signs or symptoms? Symptoms of this condition usually start quickly. Symptoms include:  Eye pain that gets worse with eye movement.  Swelling around the eye.  Eye redness.  Bulging of the eye.  Inability to move the eye.  Double vision.  Fever.  How is this diagnosed? This condition may be diagnosed based on your symptoms and an eye exam. You may also have tests to confirm the diagnosis and to check for an orbital abscess. Other tests (cultures) may be done to find out what type of bacteria is causing the infection. Tests may include:  Complete blood count (CBC).  Blood culture.  Nose, sinus, or throat culture.  Imaging studies such as a CT scan or MRI.  How is this treated? This condition is usually treated in a hospital. Antibiotic medicines are given directly into a vein through an IV tube.  At first, you may get IV antibiotics to kill bacteria that often cause orbital cellulitis (broad spectrum antibiotics).  Your medicine may be changed if cultures suggest that another antibiotic would  be better.  If the IV antibiotics are working to treat your infection, you may be switched to oral antibiotics and allowed to go home.  In some cases, surgery may be needed to drain an orbital abscess.  Follow these instructions at home:  Take medicines only as directed by your health care provider.  Take your antibiotic medicine as directed by your health care provider. Finish the  antibiotic even if you start to feel better.  Return to your normal activities as directed by your health care provider. Ask your health care provider what activities are safe for you.  Keep all follow-up visits as directed by your health care provider. This is important. Get help right away if:  Your eye pain or swelling returns or it gets worse.  You have any changes in your vision.  You have a fever. This information is not intended to replace advice given to you by your health care provider. Make sure you discuss any questions you have with your health care provider. Document Released: 12/10/2001 Document Revised: 05/23/2016 Document Reviewed: 12/12/2014 Elsevier Interactive Patient Education  [employer]2018 [employer] Inc[employer]

## 2017-11-13 NOTE — Progress Notes (Signed)
Chief Complaint  Patient presents with  . Follow-up    eye swollen. per pt eye is better    HPI  Pt is here for follow up from right eye periorbital cellulitis He was started on Bactrim and Augmentin on 11/13/2017 He is also using tobramycin He reports less pain with eye movement  4 review of systems  No past medical history on file.  Current Outpatient Medications  Medication Sig Dispense Refill  . amoxicillin-clavulanate (AUGMENTIN) 875-125 MG tablet Take 1 tablet 2 (two) times daily for 10 days by mouth. 20 tablet 0  . doxycycline (VIBRAMYCIN) 100 MG capsule Take 1 capsule (100 mg total) by mouth 2 (two) times daily. 20 capsule 0  . sulfamethoxazole-trimethoprim (BACTRIM DS,SEPTRA DS) 800-160 MG tablet Take 1 tablet 2 (two) times daily for 10 days by mouth. 20 tablet 0  . tobramycin (TOBREX) 0.3 % ophthalmic solution Place 2 drops every 4 (four) hours into the right eye. 5 mL 0  . bacitracin 500 UNIT/GM ointment Apply 1 application topically 2 (two) times daily. (Patient not taking: Reported on 10/20/2017) 14 g 0  . fluticasone (FLONASE) 50 MCG/ACT nasal spray Place 2 sprays into both nostrils daily. (Patient not taking: Reported on 10/07/2017) 16 g 6  . loratadine-pseudoephedrine (CLARITIN-D 24 HOUR) 10-240 MG 24 hr tablet Take 1 tablet by mouth daily. For allergies (Patient not taking: Reported on 10/20/2017) 60 tablet 3  . meloxicam (MOBIC) 15 MG tablet Take 1 tablet (15 mg total) by mouth daily. (Patient not taking: Reported on 10/07/2017) 30 tablet 1   No current facility-administered medications for this visit.     Allergies: No Known Allergies  No past surgical history on file.  Social History   Socioeconomic History  . Marital status: Unknown    Spouse name: None  . Number of children: None  . Years of education: None  . Highest education level: None  Social Needs  . Financial resource strain: None  . Food insecurity - worry: None  . Food insecurity -  inability: None  . Transportation needs - medical: None  . Transportation needs - non-medical: None  Occupational History  . None  Tobacco Use  . Smoking status: Never Smoker  . Smokeless tobacco: Never Used  Substance and Sexual Activity  . Alcohol use: No  . Drug use: No  . Sexual activity: None  Other Topics Concern  . None  Social History Narrative  . None    No family history on file.   ROS Review of Systems See HPI Constitution: No fevers or chills No malaise No diaphoresis Skin: No rash or itching Eyes: see hpi GU: no dysuria or hematuria Neuro: no dizziness or headaches    Objective: Vitals:   11/15/17 1128  BP: 120/62  Pulse: 77  Resp: 17  Temp: 98.5 F (36.9 C)  TempSrc: Oral  SpO2: 98%  Weight: 164 lb 12.8 oz (74.8 kg)  Height: 5\' 10"  (1.778 m)    Physical Exam  Alert and oriented, in NAD Reduced erythema of the upper lid Decreased swelling EOM intact bilaterally Right conjunctiva injected  Left eye normal Normal pulmonary effort No wheezing  Assessment and Plan Suzette BattiestYassen was seen today for follow-up.  Diagnoses and all orders for this visit:  Periorbital cellulitis of right eye  improving Continue home care, antibiotics (augmentin and bactrim) and eye drops (tobramycin) Conjunctival injection from continued wiping and due to infection Discussed how to clean the eye Gave work note to stay home and  return to work on 11/18/2017   Zoe A [employer]

## 2017-11-13 NOTE — Progress Notes (Signed)
Chief Complaint  Patient presents with  . right eye swollen    ? stye x 4 days, per pt it is painful a little    HPI  Pt reports that for the past 4 days his right eyelid has been increasingly swollen With redness and heat from the eye lid He took tobramycin eye drops yesterday and felt like the upper eye lid started oozing He reports that he cannot fully close his eye and it is watering constantly  He reports that this is his 3rd epidose He denies fevers or chills He was on doxycycline for the same eye without improvement This is his third time being seen First seen 10/07/2017 and was given topical bactracin On 10/20/17 he was given Doxycycline and tobramycin    No past medical history on file.  Current Outpatient Medications  Medication Sig Dispense Refill  . doxycycline (VIBRAMYCIN) 100 MG capsule Take 1 capsule (100 mg total) by mouth 2 (two) times daily. 20 capsule 0  . amoxicillin-clavulanate (AUGMENTIN) 875-125 MG tablet Take 1 tablet 2 (two) times daily for 10 days by mouth. 20 tablet 0  . bacitracin 500 UNIT/GM ointment Apply 1 application topically 2 (two) times daily. (Patient not taking: Reported on 10/20/2017) 14 g 0  . fluticasone (FLONASE) 50 MCG/ACT nasal spray Place 2 sprays into both nostrils daily. (Patient not taking: Reported on 10/07/2017) 16 g 6  . loratadine-pseudoephedrine (CLARITIN-D 24 HOUR) 10-240 MG 24 hr tablet Take 1 tablet by mouth daily. For allergies (Patient not taking: Reported on 10/20/2017) 60 tablet 3  . meloxicam (MOBIC) 15 MG tablet Take 1 tablet (15 mg total) by mouth daily. (Patient not taking: Reported on 10/07/2017) 30 tablet 1  . sulfamethoxazole-trimethoprim (BACTRIM DS,SEPTRA DS) 800-160 MG tablet Take 1 tablet 2 (two) times daily for 10 days by mouth. 20 tablet 0  . tobramycin (TOBREX) 0.3 % ophthalmic solution Place 2 drops every 4 (four) hours into the right eye. 5 mL 0   No current facility-administered medications for this visit.      Allergies: No Known Allergies  No past surgical history on file.  Social History   Socioeconomic History  . Marital status: Unknown    Spouse name: None  . Number of children: None  . Years of education: None  . Highest education level: None  Social Needs  . Financial resource strain: None  . Food insecurity - worry: None  . Food insecurity - inability: None  . Transportation needs - medical: None  . Transportation needs - non-medical: None  Occupational History  . None  Tobacco Use  . Smoking status: Never Smoker  . Smokeless tobacco: Never Used  Substance and Sexual Activity  . Alcohol use: No  . Drug use: No  . Sexual activity: None  Other Topics Concern  . None  Social History Narrative  . None    No family history on file.   ROS Review of Systems See HPI Constitution: No fevers or chills No malaise No diaphoresis Skin: No rash or itching Eyes: no blurry vision, no double vision GU: no dysuria or hematuria Neuro: no dizziness or headaches    Objective: Vitals:   11/13/17 0938  BP: 102/66  Pulse: 85  Resp: 16  Temp: 98.8 F (37.1 C)  TempSrc: Oral  SpO2: 98%  Weight: 163 lb 3.2 oz (74 kg)  Height: 5\' 10"  (1.778 m)    Physical Exam  Constitutional: He is oriented to person, place, and time. He appears well-developed  and well-nourished.  HENT:  Head: Normocephalic and atraumatic.  Eyes: Conjunctivae are normal. Pupils are equal, round, and reactive to light. Right eye exhibits discharge, exudate and hordeolum. Right eye exhibits no chemosis. No foreign body present in the right eye. Left eye exhibits no chemosis, no discharge, no exudate and no hordeolum. No foreign body present in the left eye. No scleral icterus. Right eye exhibits normal extraocular motion and no nystagmus. Left eye exhibits normal extraocular motion and no nystagmus. Right pupil is round and reactive. Left pupil is round and reactive. Pupils are equal.  No proptosis    Cardiovascular: Normal rate, regular rhythm and normal heart sounds.  No murmur heard. Pulmonary/Chest: Effort normal and breath sounds normal. No stridor. No respiratory distress.  Neurological: He is alert and oriented to person, place, and time.   Lab Results  Component Value Date   WBC 4.7 11/13/2017   HGB 14.5 11/13/2017   HCT 42.6 (A) 11/13/2017   MCV 85.1 11/13/2017   PLT 230 07/14/2017   Wound Culture 10/20/2017 Susceptible to Tetracycline, Clindamycin, Levofloxacin, Erythromycin, Ciprofloxacin, Bactrim  Orbital xray 11/13/17 Normal orbital xray, no emphysema, no sinus air-fluid levels  No fracture  No bone changes  Assessment and Plan Steve BattiestYassen was seen today for right eye swollen.  Diagnoses and all orders for this visit:  Periorbital cellulitis of right eye -     POCT CBC -     DG Orbits -     amoxicillin-clavulanate (AUGMENTIN) 875-125 MG tablet; Take 1 tablet 2 (two) times daily for 10 days by mouth. -     sulfamethoxazole-trimethoprim (BACTRIM DS,SEPTRA DS) 800-160 MG tablet; Take 1 tablet 2 (two) times daily for 10 days by mouth.  Hordeolum externum of right upper eyelid -     tobramycin (TOBREX) 0.3 % ophthalmic solution; Place 2 drops every 4 (four) hours into the right eye.   Used systemic antibiotic (Augmentin and Bactrim DS) and tobramycin drops Continue compress Close follow up in 2 days and if no improvement will admit for IV antibiotics  A total of 25 minutes were spent face-to-face with the patient during this encounter and over half of that time was spent on counseling and coordination of care.  Time was spent with patient reviewed xray result, discussing previous wound culture and identifying risk factors  No compromise to vision currently seen   Steve Alvarez

## 2017-11-15 ENCOUNTER — Encounter: Payer: Self-pay | Admitting: Family Medicine

## 2017-11-15 ENCOUNTER — Ambulatory Visit: Payer: BLUE CROSS/BLUE SHIELD | Admitting: Family Medicine

## 2017-11-15 ENCOUNTER — Other Ambulatory Visit: Payer: Self-pay

## 2017-11-15 VITALS — BP 120/62 | HR 77 | Temp 98.5°F | Resp 17 | Ht 70.0 in | Wt 164.8 lb

## 2017-11-15 DIAGNOSIS — L03213 Periorbital cellulitis: Secondary | ICD-10-CM | POA: Diagnosis not present

## 2017-11-15 NOTE — Patient Instructions (Signed)
     IF you received an x-ray today, you will receive an invoice from [facility]. Please contact [facility] at [phone] with questions or concerns regarding your invoice.   IF you received labwork today, you will receive an invoice from [facility]. Please contact [facility] at [phone] with questions or concerns regarding your invoice.   Our billing staff will not be able to assist you with questions regarding bills from these companies.  You will be contacted with the lab results as soon as they are available. The fastest way to get your results is to activate your My Chart account. Instructions are located on the last page of this paperwork. If you have not heard from us regarding the results in 2 weeks, please contact this office.     

## 2018-01-21 IMAGING — DX DG FOOT 2V*R*
2 series · 2 of 2 positions shown · non-contrast
Comparison: None.

CLINICAL DATA: Right foot pain. Evaluate for possible foreign body.

EXAM:
RIGHT FOOT - 2 VIEW

[foot ap]
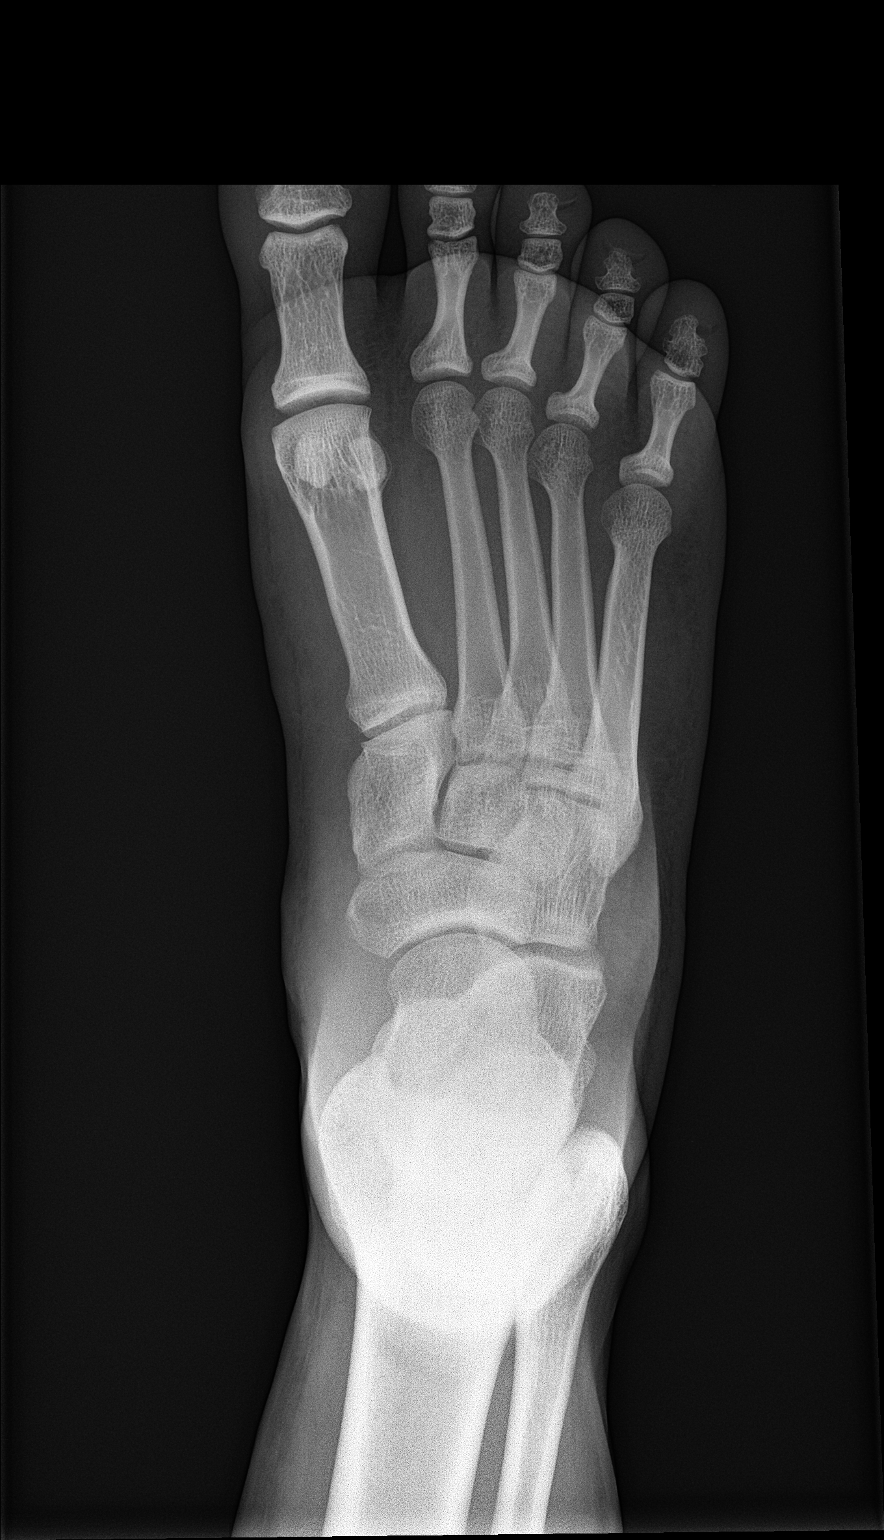

[foot lat]
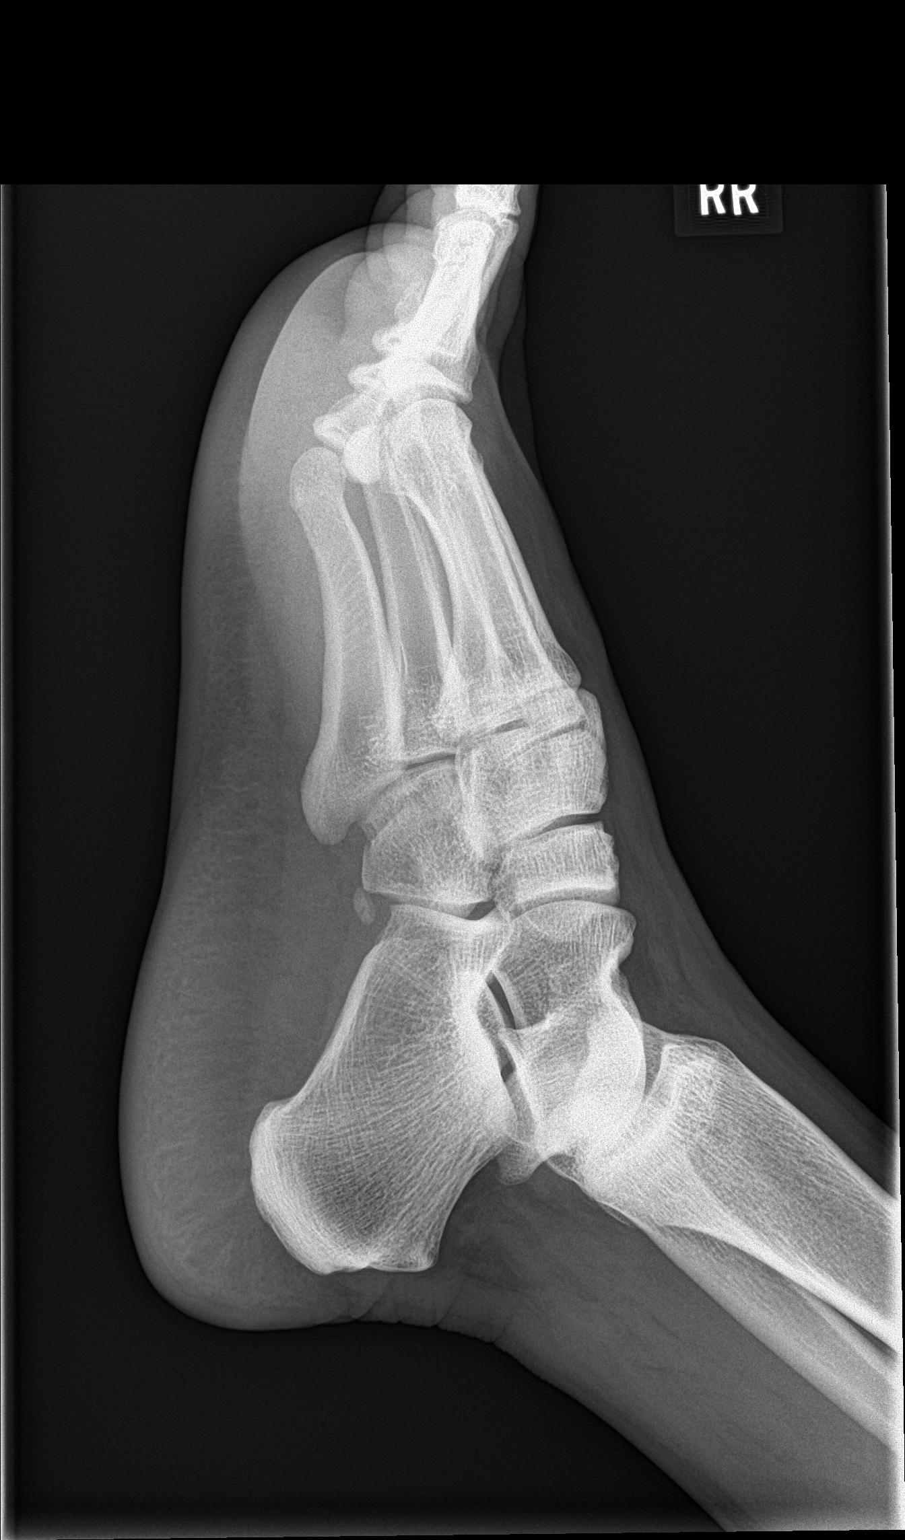

[2 of 2 positions shown; findings below may reference images not displayed]

FINDINGS: No fracture, bone lesion or joint abnormality.

The soft tissues are unremarkable.  No radiopaque foreign body.
IMPRESSION: Normal exam.  No radiopaque foreign body.

## 2018-05-20 IMAGING — DX DG ORBITS COMPLETE 4+V
4 series · 4 of 4 positions shown · non-contrast
Comparison: None.

CLINICAL DATA: Right eye cellulitis

EXAM:
ORBITS - COMPLETE 4+ VIEW

[orbital axial (1 of 4)]
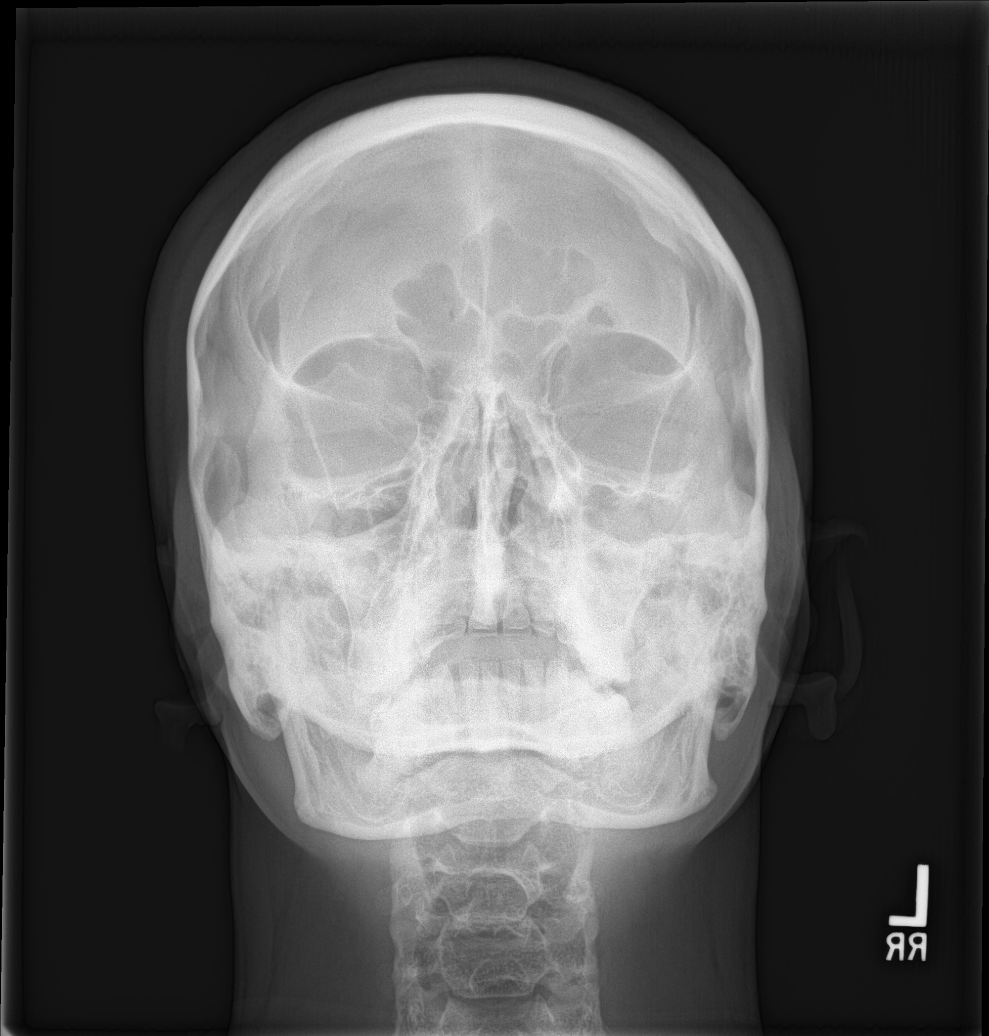

[orbital axial (2 of 4)]
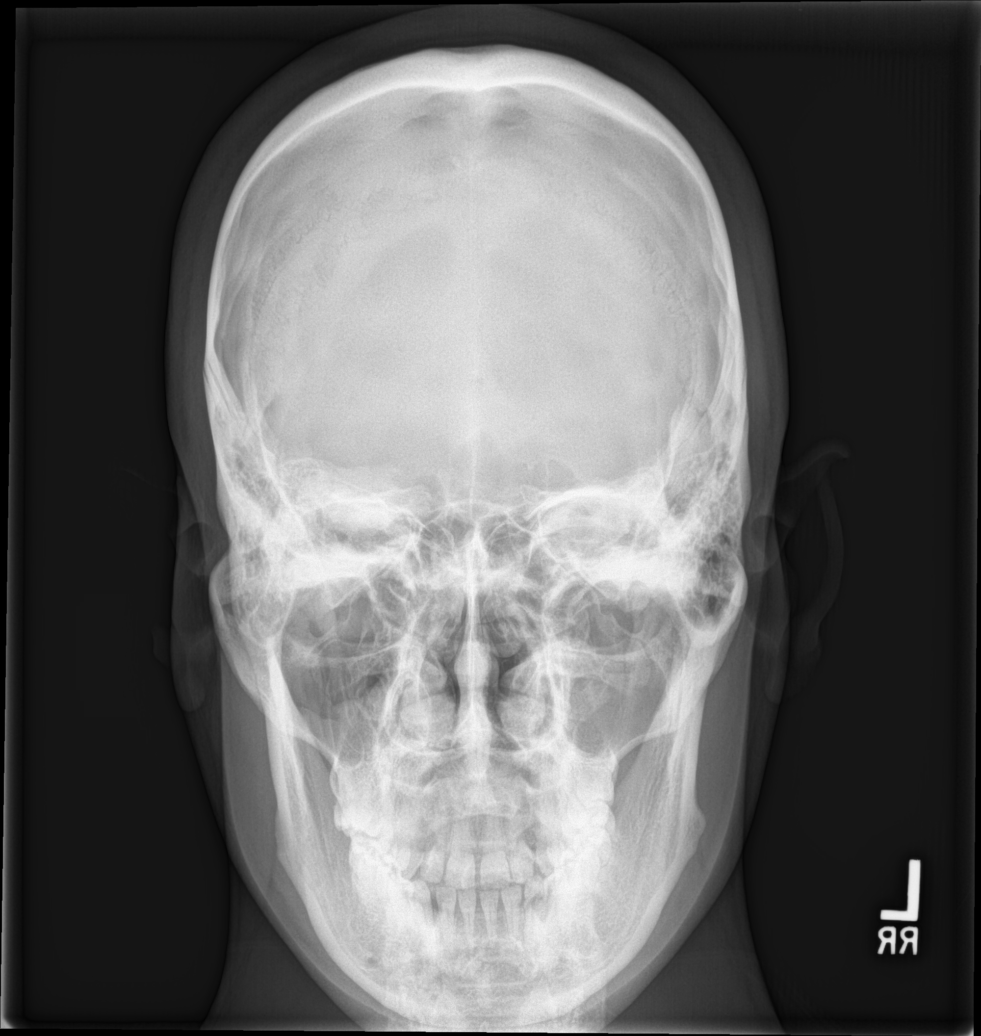

[orbital axial (3 of 4)]
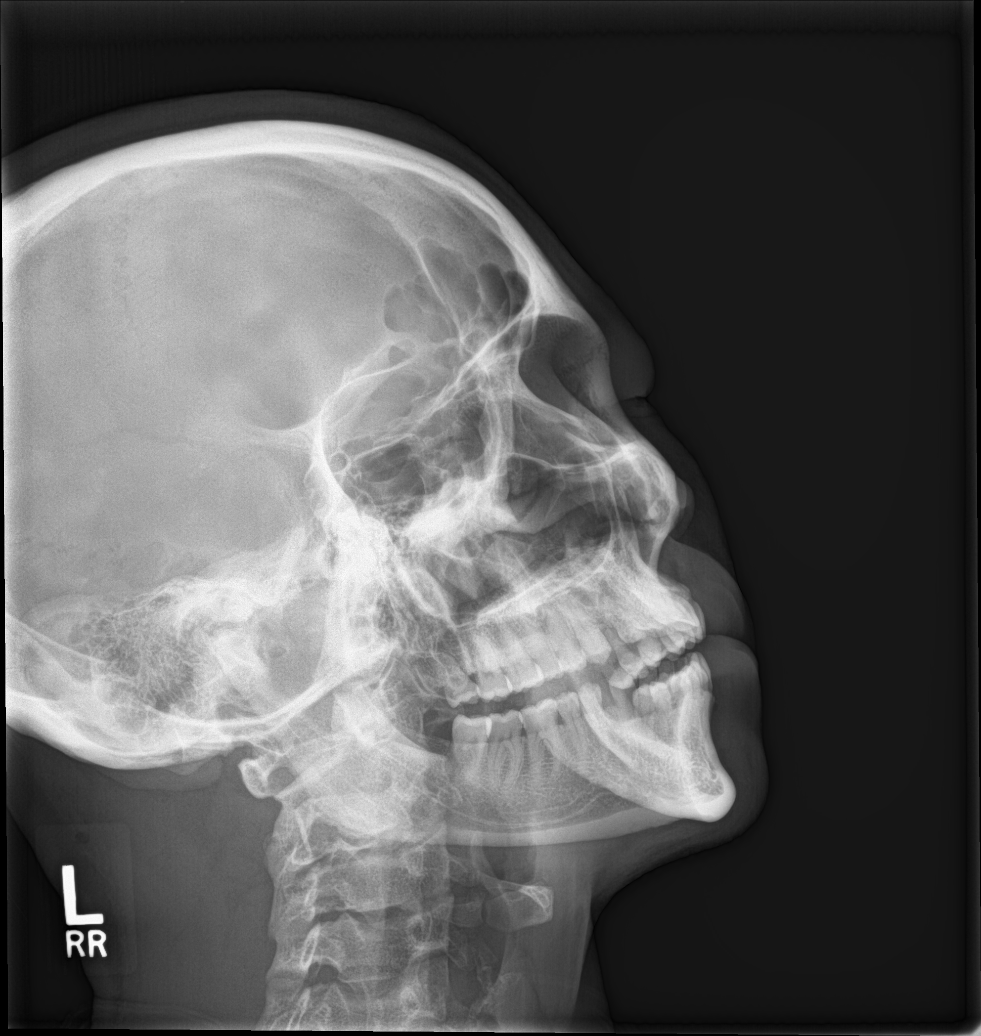

[orbital axial (4 of 4)]
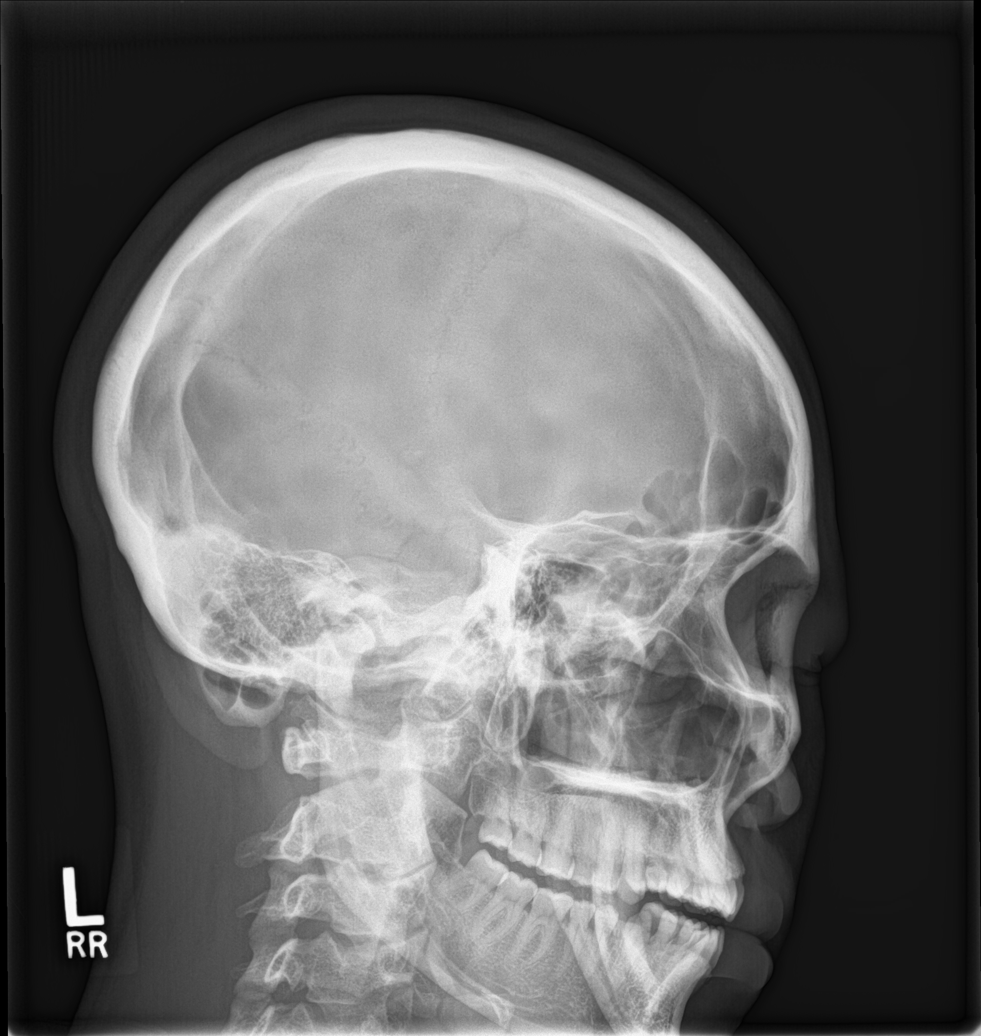

[4 of 4 positions shown; findings below may reference images not displayed]

FINDINGS: There is no evidence of fracture or other significant bone
abnormality. No orbital emphysema or sinus air-fluid levels are
seen.
IMPRESSION: No acute osseous injury of the orbits.
